# Patient Record
Sex: Male | Born: 2000 | Hispanic: Yes | Marital: Single | Smoking: Never smoker
Health system: Southern US, Community
[De-identification: ages and names within clinical notes are randomized; demographics above are authoritative.]

---
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
## 2019-01-27 ENCOUNTER — Encounter (HOSPITAL_BASED_OUTPATIENT_CLINIC_OR_DEPARTMENT_OTHER): Payer: Self-pay

## 2019-01-27 ENCOUNTER — Other Ambulatory Visit: Payer: Self-pay

## 2019-01-27 ENCOUNTER — Emergency Department (HOSPITAL_BASED_OUTPATIENT_CLINIC_OR_DEPARTMENT_OTHER): Payer: Self-pay

## 2019-01-27 ENCOUNTER — Emergency Department (HOSPITAL_BASED_OUTPATIENT_CLINIC_OR_DEPARTMENT_OTHER)
Admission: EM | Admit: 2019-01-27 | Discharge: 2019-01-27 | Disposition: A | Discharge status: Home / Self Care | Payer: Self-pay | Attending: Emergency Medicine | Admitting: Emergency Medicine

## 2019-01-27 DIAGNOSIS — R109 Unspecified abdominal pain: Secondary | ICD-10-CM

## 2019-01-27 DIAGNOSIS — R110 Nausea: Secondary | ICD-10-CM

## 2019-01-27 DIAGNOSIS — R1013 Epigastric pain: Secondary | ICD-10-CM | POA: Insufficient documentation

## 2019-01-27 DIAGNOSIS — R634 Abnormal weight loss: Secondary | ICD-10-CM | POA: Insufficient documentation

## 2019-01-27 DIAGNOSIS — R1030 Lower abdominal pain, unspecified: Secondary | ICD-10-CM | POA: Insufficient documentation

## 2019-01-27 LAB — CBC WITH DIFFERENTIAL/PLATELET
Abs Immature Granulocytes: 0.01 10*3/uL (ref 0.00–0.07)
Basophils Absolute: 0 10*3/uL (ref 0.0–0.1)
Basophils Relative: 0 %
Eosinophils Absolute: 0 10*3/uL (ref 0.0–0.5)
Eosinophils Relative: 0 %
HCT: 45.9 % (ref 39.0–52.0)
Hemoglobin: 15.4 g/dL (ref 13.0–17.0)
Immature Granulocytes: 0 %
Lymphocytes Relative: 13 %
Lymphs Abs: 1.2 10*3/uL (ref 0.7–4.0)
MCH: 29.8 pg (ref 26.0–34.0)
MCHC: 33.6 g/dL (ref 30.0–36.0)
MCV: 88.8 fL (ref 80.0–100.0)
Monocytes Absolute: 0.4 10*3/uL (ref 0.1–1.0)
Monocytes Relative: 4 %
Neutro Abs: 7.9 10*3/uL — ABNORMAL HIGH (ref 1.7–7.7)
Neutrophils Relative %: 83 %
Platelets: 219 10*3/uL (ref 150–400)
RBC: 5.17 MIL/uL (ref 4.22–5.81)
RDW: 11.9 % (ref 11.5–15.5)
WBC: 9.6 10*3/uL (ref 4.0–10.5)
nRBC: 0 % (ref 0.0–0.2)

## 2019-01-27 LAB — COMPREHENSIVE METABOLIC PANEL
ALT: 14 U/L (ref 0–44)
AST: 26 U/L (ref 15–41)
Albumin: 5.2 g/dL — ABNORMAL HIGH (ref 3.5–5.0)
Alkaline Phosphatase: 74 U/L (ref 38–126)
Anion gap: 17 — ABNORMAL HIGH (ref 5–15)
BUN: 17 mg/dL (ref 6–20)
CO2: 21 mmol/L — ABNORMAL LOW (ref 22–32)
Calcium: 9.3 mg/dL (ref 8.9–10.3)
Chloride: 100 mmol/L (ref 98–111)
Creatinine, Ser: 0.88 mg/dL (ref 0.61–1.24)
GFR calc Af Amer: >60 mL/min (ref >60)
GFR calc non Af Amer: >60 mL/min (ref >60)
Glucose, Bld: 91 mg/dL (ref 70–99)
Potassium: 3.9 mmol/L (ref 3.5–5.1)
Sodium: 138 mmol/L (ref 135–145)
Total Bilirubin: 1.5 mg/dL — ABNORMAL HIGH (ref 0.3–1.2)
Total Protein: 8.2 g/dL — ABNORMAL HIGH (ref 6.5–8.1)

## 2019-01-27 LAB — LIPASE, BLOOD: Lipase: 21 U/L (ref 11–51)

## 2019-01-27 MED ORDER — DICYCLOMINE HCL 20 MG PO TABS: 20.0000 mg | ORAL_TABLET | Freq: Two times a day (BID) | ORAL | 0 refills | Status: AC | PRN

## 2019-01-27 MED ORDER — ONDANSETRON HCL 4 MG/2ML IJ SOLN
4.0000 mg | Freq: Once | INTRAMUSCULAR | Status: AC
  Administered 2019-01-27: 20:00:00 4 mg via INTRAVENOUS
  Filled 2019-01-27: qty 2

## 2019-01-27 MED ORDER — SODIUM CHLORIDE 0.9 % IV BOLUS
1000.0000 mL | Freq: Once | INTRAVENOUS | Status: AC
  Administered 2019-01-27: 1000 mL via INTRAVENOUS

## 2019-01-27 MED ORDER — IOHEXOL 300 MG/ML  SOLN
100.0000 mL | Freq: Once | INTRAMUSCULAR | Status: AC | PRN
  Administered 2019-01-27: 100 mL via INTRAVENOUS

## 2019-01-27 MED ORDER — ONDANSETRON 4 MG PO TBDP: 4.0000 mg | ORAL_TABLET | Freq: Three times a day (TID) | ORAL | 0 refills | Status: AC | PRN

## 2019-01-27 NOTE — ED Triage Notes (Addendum)
Pt c/o lower abd pain x 3 week-denies v/d-also c/o constipation x 2 weeks-NAD-steady gait

## 2019-01-27 NOTE — ED Notes (Signed)
Pt understood dc material. NAD noted. All questions answered to satisfaction. Pt escorted to check out counter. 

## 2019-01-27 NOTE — Discharge Instructions (Addendum)
Use Zofran as needed for nausea or vomiting. Use Bentyl as needed for abdominal pain or spasm. It is very important that you follow-up with the GI doctor listed below for further evaluation management of your symptoms. Return to the emergency room if you develop high fevers, persistent vomiting, severe worsening abdominal pain, or any new, worsening, concerning symptoms.

## 2019-01-27 NOTE — ED Provider Notes (Signed)
[facility] EMERGENCY DEPARTMENT Provider Note   CSN: [id number] Arrival date & time: 01/27/19  1801     History   Chief Complaint Chief Complaint  Patient presents with  . Abdominal Pain    HPI Jason Bender is a 18 y.o. male presenting for evaluation of nausea and abdominal pain.  Patient states the past 3-1/2 weeks, he has been having abdominal symptoms.  He reports frequent nausea and intermittent abdominal pain.  Pain is bilateral and radiates towards his epigastric abdomen.  He reports feeling like he needs to have a bowel movement, and sometimes having no stool or just a little.  He states he is trying to continue to eat, but has lost 20 to 30 pounds in the past 3 weeks.  He denies fevers, chills, sore throat, chest pain, cough, shortness of breath, or abnormal urination.  He denies diarrhea.  He denies blood in the stool.  No history of abdominal problems.  No family history of abdominal problems.  He denies a history abdominal surgeries.  He has been taking a medicine for heartburn daily without improvement of symptoms.  He has not taken anything else including Tylenol.     HPI  History reviewed. No pertinent past medical history.  There are no active problems to display for this patient.   History reviewed. No pertinent surgical history.      Home Medications    Prior to Admission medications   Medication Sig Start Date End Date Taking? Authorizing Provider  dicyclomine (BENTYL) 20 MG tablet Take 1 tablet (20 mg total) by mouth 2 (two) times daily as needed for spasms. 01/27/19   Christabelle Hanzlik, PA-C  ibuprofen (ADVIL,MOTRIN) 200 MG tablet Take 400 mg by mouth every 8 (eight) hours as needed for pain.    [provider]  ondansetron (ZOFRAN ODT) 4 MG disintegrating tablet Take 1 tablet (4 mg total) by mouth every 8 (eight) hours as needed for nausea or vomiting. 01/27/19   Kyshon Tolliver, PA-C    Family History No family history  on file.  Social History Social History   Tobacco Use  . Smoking status: Never Smoker  . Smokeless tobacco: Never Used  Substance Use Topics  . Alcohol use: No  . Drug use: No     Allergies   Penicillins   Review of Systems Review of Systems  Constitutional: Positive for unexpected weight change.  Gastrointestinal: Positive for abdominal pain, constipation and nausea.  All other systems reviewed and are negative.    Physical Exam Updated Vital Signs BP 140/75   Pulse 91   Temp 97.9 F (36.6 C) (Oral)   Resp 18   Ht 5\' 11"  (1.803 m)   Wt 45.8 kg   SpO2 100%   BMI 14.09 kg/m   Physical Exam Vitals signs and nursing note reviewed.  Constitutional:      General: He is not in acute distress.    Appearance: He is well-developed.     Comments: Appears nontoxic  HENT:     Head: Normocephalic and atraumatic.     Mouth/Throat:     Mouth: Mucous membranes are moist.  Eyes:     Conjunctiva/sclera: Conjunctivae normal.     Pupils: Pupils are equal, round, and reactive to light.  Neck:     Musculoskeletal: Normal range of motion and neck supple.  Cardiovascular:     Rate and Rhythm: Normal rate and regular rhythm.     Pulses: Normal pulses.     Comments:  Pt initially tahcycardic upon check-in.  Patient states he was very anxious.  On my evaluation, heart rate in the 90s. Pulmonary:     Effort: Pulmonary effort is normal. No respiratory distress.     Breath sounds: Normal breath sounds. No wheezing.  Abdominal:     General: There is no distension.     Palpations: Abdomen is soft. There is no mass.     Tenderness: There is abdominal tenderness. There is no guarding or rebound.     Comments: Generalized tenderness palpation of the abdomen, worse in the epigastric and lower abdomen.  No rigidity, guarding, distention.  Negative rebound.  Negative Murphy's.  Musculoskeletal: Normal range of motion.  Skin:    General: Skin is warm and dry.     Capillary Refill:  Capillary refill takes less than 2 seconds.  Neurological:     Mental Status: He is alert and oriented to person, place, and time.      ED Treatments / Results  Labs (all labs ordered are listed, but only abnormal results are displayed) Labs Reviewed  CBC WITH DIFFERENTIAL/PLATELET - Abnormal; Notable for the following components:      Result Value   Neutro Abs 7.9 (*)    All other components within normal limits  COMPREHENSIVE METABOLIC PANEL - Abnormal; Notable for the following components:   CO2 21 (*)    Total Protein 8.2 (*)    Albumin 5.2 (*)    Total Bilirubin 1.5 (*)    Anion gap 17 (*)    All other components within normal limits  LIPASE, BLOOD    EKG None  Radiology Ct Abdomen Pelvis W Contrast  Result Date: 01/27/2019 CLINICAL DATA:  18 year old male with nausea and vomiting and weight loss. EXAM: CT ABDOMEN AND PELVIS WITH CONTRAST TECHNIQUE: Multidetector CT imaging of the abdomen and pelvis was performed using the standard protocol following bolus administration of intravenous contrast. CONTRAST:  100mL OMNIPAQUE IOHEXOL 300 MG/ML  SOLN COMPARISON:  CT of the abdomen pelvis dated 01/01/2013 FINDINGS: Lower chest: The visualized lung bases are clear. No intra-abdominal free air or free fluid. Hepatobiliary: No focal liver abnormality is seen. No gallstones, gallbladder wall thickening, or biliary dilatation. Pancreas: Unremarkable. No pancreatic ductal dilatation or surrounding inflammatory changes. Spleen: Normal in size without focal abnormality. Adrenals/Urinary Tract: Adrenal glands are unremarkable. Kidneys are normal, without renal calculi, focal lesion, or hydronephrosis. Bladder is unremarkable. Stomach/Bowel: There is no bowel obstruction or active inflammation. The appendix is normal. Vascular/Lymphatic: The abdominal aorta and IVC are unremarkable. No portal venous gas. There is no adenopathy. Reproductive: The prostate and seminal vesicles are grossly  unremarkable. No pelvic mass. Other: None Musculoskeletal: No acute or significant osseous findings. IMPRESSION: No acute intra-abdominal or pelvic pathology. Electronically Signed   By: Elgie CollardArash  Radparvar M.D.   On: 01/27/2019 20:50    Procedures Procedures (including critical care time)  Medications Ordered in ED Medications  ondansetron (ZOFRAN) injection 4 mg (4 mg Intravenous Given 01/27/19 1957)  sodium chloride 0.9 % bolus 1,000 mL (0 mLs Intravenous Stopped 01/27/19 2117)  iohexol (OMNIPAQUE) 300 MG/ML solution 100 mL (100 mLs Intravenous Contrast Given 01/27/19 2029)     Initial Impression / Assessment and Plan / ED Course  I have reviewed the triage vital signs and the nursing notes.  Pertinent labs & imaging results that were available during my care of the patient were reviewed by me and considered in my medical decision making (see chart for details).  Patient presenting for evaluation of nausea, abdominal pain, constipation, movements.  Physical exam shows patient appears nontoxic.  However, I am concerned about his history and his weight loss.  Will obtain labs and CT for further evaluation.  Zofran and fluids for symptom control.  Labs overall reassuring.  No leukocytosis.  Kidney, liver, pancreatic function reassuring.  Bili slightly high at 1.5, without fever, psychosis,, low suspicion for gallbladder etiology.  ct pending.  ct without acute abnormalities.  no obvious cause of patient's symptoms.  discussed findings with patient.  on reassessment, he reports no change in his symptoms.  he is not currently having any nausea or pain.  will p.o. challenge and reassess.  patient tolerated p.o. without difficulty.  discussed symptomatic control with nausea and bentyl.  encouraged follow-up with gi for further evaluation.  at this time, patient appears safe for discharge.  Return precautions given.  patient states he understands and agrees to plan.   Final Clinical  Impressions(s) / ED Diagnoses   Final diagnoses:  Intermittent abdominal pain  Nausea  Weight loss    ED Discharge Orders         Ordered    ondansetron (ZOFRAN ODT) 4 MG disintegrating tablet  Every 8 hours PRN     01/27/19 2208    dicyclomine (BENTYL) 20 MG tablet  2 times daily PRN     01/27/19 2208           Franchot Heidelberg, PA-C 01/27/19 2215    Margette Fast, MD 01/28/19 [phone]

## 2019-01-27 NOTE — ED Notes (Signed)
ED Provider at bedside. 

## 2019-01-27 NOTE — ED Notes (Signed)
Patient transported to CT 

## 2019-01-27 NOTE — ED Notes (Signed)
Pt was able to tolerate PO water and ginger ale. Pt ambulated to restroom without out assistance.
